# Patient Record
Sex: Female | Born: 2000
Health system: Southern US, Community
[De-identification: ages and names within clinical notes are randomized; demographics above are authoritative.]

## PROBLEM LIST (undated history)

## (undated) DIAGNOSIS — K219 Gastro-esophageal reflux disease without esophagitis: Secondary | ICD-10-CM

## (undated) DIAGNOSIS — E282 Polycystic ovarian syndrome: Secondary | ICD-10-CM

## (undated) DIAGNOSIS — G43909 Migraine, unspecified, not intractable, without status migrainosus: Secondary | ICD-10-CM

## (undated) DIAGNOSIS — Z9889 Other specified postprocedural states: Secondary | ICD-10-CM

## (undated) DIAGNOSIS — R112 Nausea with vomiting, unspecified: Secondary | ICD-10-CM

## (undated) HISTORY — PX: WISDOM TOOTH EXTRACTION: SHX21

## (undated) HISTORY — PX: LARYNGOSCOPY: SUR817

---
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
## 2021-08-03 ENCOUNTER — Other Ambulatory Visit: Payer: Self-pay | Admitting: Internal Medicine

## 2021-08-03 DIAGNOSIS — R1013 Epigastric pain: Secondary | ICD-10-CM

## 2021-08-16 ENCOUNTER — Ambulatory Visit
Admission: RE | Admit: 2021-08-16 | Discharge: 2021-08-16 | Disposition: A | Discharge status: Home / Self Care | Payer: 59 | Source: Ambulatory Visit | Attending: Internal Medicine | Admitting: Internal Medicine

## 2021-08-16 DIAGNOSIS — R1013 Epigastric pain: Secondary | ICD-10-CM

## 2021-09-13 ENCOUNTER — Encounter: Payer: Self-pay | Admitting: Surgery

## 2021-09-13 ENCOUNTER — Ambulatory Visit: Payer: Self-pay | Admitting: Surgery

## 2021-09-13 DIAGNOSIS — J019 Acute sinusitis, unspecified: Secondary | ICD-10-CM | POA: Insufficient documentation

## 2021-09-13 DIAGNOSIS — B079 Viral wart, unspecified: Secondary | ICD-10-CM | POA: Insufficient documentation

## 2021-09-13 DIAGNOSIS — Z6830 Body mass index (BMI) 30.0-30.9, adult: Secondary | ICD-10-CM | POA: Insufficient documentation

## 2021-09-13 DIAGNOSIS — R12 Heartburn: Secondary | ICD-10-CM | POA: Insufficient documentation

## 2021-09-13 DIAGNOSIS — J309 Allergic rhinitis, unspecified: Secondary | ICD-10-CM | POA: Insufficient documentation

## 2021-09-13 DIAGNOSIS — H6691 Otitis media, unspecified, right ear: Secondary | ICD-10-CM

## 2021-09-13 DIAGNOSIS — J020 Streptococcal pharyngitis: Secondary | ICD-10-CM

## 2021-09-13 DIAGNOSIS — S060X9A Concussion with loss of consciousness of unspecified duration, initial encounter: Secondary | ICD-10-CM

## 2021-09-13 DIAGNOSIS — K8010 Calculus of gallbladder with chronic cholecystitis without obstruction: Secondary | ICD-10-CM | POA: Insufficient documentation

## 2021-09-13 DIAGNOSIS — K760 Fatty (change of) liver, not elsewhere classified: Secondary | ICD-10-CM | POA: Insufficient documentation

## 2021-09-13 DIAGNOSIS — J029 Acute pharyngitis, unspecified: Secondary | ICD-10-CM

## 2021-09-13 DIAGNOSIS — R519 Headache, unspecified: Secondary | ICD-10-CM | POA: Insufficient documentation

## 2021-09-13 DIAGNOSIS — Z79899 Other long term (current) drug therapy: Secondary | ICD-10-CM | POA: Insufficient documentation

## 2021-09-13 DIAGNOSIS — F901 Attention-deficit hyperactivity disorder, predominantly hyperactive type: Secondary | ICD-10-CM | POA: Insufficient documentation

## 2021-09-13 DIAGNOSIS — J45990 Exercise induced bronchospasm: Secondary | ICD-10-CM | POA: Insufficient documentation

## 2021-09-13 HISTORY — DX: Streptococcal pharyngitis: J02.0

## 2021-09-13 HISTORY — DX: Concussion with loss of consciousness of unspecified duration, initial encounter: S06.0X9A

## 2021-09-13 HISTORY — DX: Acute pharyngitis, unspecified: J02.9

## 2021-09-13 HISTORY — DX: Otitis media, unspecified, right ear: H66.91

## 2021-10-06 NOTE — Patient Instructions (Addendum)
DUE TO COVID-19 ONLY TWO VISITORS  (aged 20 and older)  ARE ALLOWED TO COME WITH YOU AND STAY IN THE WAITING ROOM ONLY DURING PRE OP AND PROCEDURE.   ?**NO VISITORS ARE ALLOWED IN THE SHORT STAY AREA OR RECOVERY ROOM!!** ? ?IF YOU WILL BE ADMITTED INTO THE HOSPITAL YOU ARE ALLOWED ONLY FOUR SUPPORT PEOPLE DURING VISITATION HOURS ONLY (7 AM -8PM)   ?The support person(s) must pass our screening, gel in and out, and wear a mask at all times, including in the patient?s room. ?Patients must also wear a mask when staff or their support person are in the room. ?Visitors GUEST BADGE MUST BE WORN VISIBLY  ?One adult visitor may remain with you overnight and MUST be in the room by 8 P.M. ?  ? ? Your procedure is scheduled on: 10/20/21 ? ? Report to [facility] Main Entrance ? ?  Report to admitting at  1:15 PM ? ? Call this number if you have problems the morning of surgery [phone] ? ? Do not eat food :After Midnight. ? ? After Midnight you may have the following liquids until 12:30 PM  DAY OF SURGERY ? ?Water ?Black Coffee (sugar ok, NO MILK/CREAM OR CREAMERS)  ?Tea (sugar ok, NO MILK/CREAM OR CREAMERS) regular and decaf                             ?Plain Jell-O (NO RED)                                           ?Fruit ices (not with fruit pulp, NO RED)                                     ?Popsicles (NO RED)                                                                  ?Juice: apple, WHITE grape, WHITE cranberry ?Sports drinks like Gatorade (NO RED) ?Clear broth(vegetable,chicken,beef) ?  ?       If you have questions, please contact your surgeon?s office. ? ?Drink one Pre-Surgery Ensure the morning of surgery at 12:30 PM ?  ?Oral Hygiene is also important to reduce your risk of infection.                                    ?Remember - BRUSH YOUR TEETH THE MORNING OF SURGERY WITH YOUR REGULAR TOOTHPASTE ? ? Do NOT smoke after Midnight ? ? Take these medicines the morning of surgery with A SIP OF WATER:   Allegra, Pantoprazole..   Use your inhaler and bring it with you ?                  ?           You may not have any metal on your body including hair pins, jewelry, and body piercing ? ?  Do not wear make-up, lotions, powders, perfumes or deodorant ? ?Do not wear nail polish including gel and S&S, artificial/acrylic nails, or any other type of covering on natural nails including finger and toenails. If you have artificial nails, gel coating, etc. that needs to be removed by a nail salon please have this removed prior to surgery or surgery may need to be canceled/ delayed if the surgeon/ anesthesia feels like they are unable to be safely monitored.  ? ?Do not shave  48 hours prior to surgery.  ? ? Do not bring valuables to the hospital. [place]. ? ? Contacts, dentures or bridgework may not be worn into surgery. ?  ? Patients discharged on the day of surgery will not be allowed to drive home.  Someone NEEDS to stay with you for the first 24 hours after anesthesia. ? ? Please read over the following fact sheets you were given: IF [place]  ? ?   [facility] - Preparing for Surgery ?Before surgery, you can play an important role.  Because skin is not sterile, your skin needs to be as free of germs as possible.  You can reduce the number of germs on your skin by washing with CHG (chlorahexidine gluconate) soap before surgery.  CHG is an antiseptic cleaner which kills germs and bonds with the skin to continue killing germs even after washing. ?Please DO NOT use if you have an allergy to CHG or antibacterial soaps.  If your skin becomes reddened/irritated stop using the CHG and inform your nurse when you arrive at Short Stay. ?Do not shave (including legs and underarms) for at least 48 hours prior to the first CHG shower.   ?Please follow these instructions carefully: ? 1.  Shower with CHG Soap the night  before surgery and the  morning of Surgery. ? 2.  If you choose to wash your hair, wash your hair first as usual with your  normal  shampoo. ? 3.  After you shampoo, rinse your hair and body thoroughly to remove the  shampoo.                      ?      4.  Use CHG as you would any other liquid soap.  You can apply chg directly  to the skin and wash  ?                     Gently with a scrungie or clean washcloth. ? 5.  Apply the CHG Soap to your body ONLY FROM THE NECK DOWN.   Do not use on face/ open      ?                     Wound or open sores. Avoid contact with eyes, ears mouth and genitals (private parts).  ?                     Production manager,  Genitals (private parts) with your normal soap. ?            6.  Wash thoroughly, paying special attention to the area where your surgery  will be performed. ? 7.  Thoroughly rinse your body with warm water from the neck down. ? 8.  DO NOT shower/wash with your normal soap after using and  rinsing off  the CHG Soap. ?               9.  Pat yourself dry with a clean towel. ?           10.  Wear clean pajamas. ?           11.  Place clean sheets on your bed the night of your first shower and do not  sleep with pets. ?Day of Surgery : ?Do not apply any lotions/deodorants the morning of surgery.  Please wear clean clothes to the hospital/surgery center. ? ?FAILURE TO FOLLOW THESE INSTRUCTIONS MAY RESULT IN THE CANCELLATION OF YOUR SURGERY ? ? ?________________________________________________________________________  ?

## 2021-10-08 ENCOUNTER — Encounter (HOSPITAL_COMMUNITY): Payer: Self-pay

## 2021-10-08 ENCOUNTER — Encounter (HOSPITAL_COMMUNITY)
Admission: RE | Admit: 2021-10-08 | Discharge: 2021-10-08 | Disposition: A | Discharge status: Home / Self Care | Payer: 59 | Source: Ambulatory Visit | Attending: Surgery | Admitting: Surgery

## 2021-10-08 ENCOUNTER — Other Ambulatory Visit: Payer: Self-pay

## 2021-10-08 DIAGNOSIS — Z01818 Encounter for other preprocedural examination: Secondary | ICD-10-CM

## 2021-10-08 DIAGNOSIS — Z01812 Encounter for preprocedural laboratory examination: Secondary | ICD-10-CM | POA: Insufficient documentation

## 2021-10-08 HISTORY — DX: Migraine, unspecified, not intractable, without status migrainosus: G43.909

## 2021-10-08 HISTORY — DX: Gastro-esophageal reflux disease without esophagitis: K21.9

## 2021-10-08 HISTORY — DX: Other specified postprocedural states: R11.2

## 2021-10-08 HISTORY — DX: Polycystic ovarian syndrome: E28.2

## 2021-10-08 HISTORY — DX: Other specified postprocedural states: Z98.890

## 2021-10-08 LAB — COMPREHENSIVE METABOLIC PANEL
ALT: 29 U/L (ref 0–44)
AST: 35 U/L (ref 15–41)
Albumin: 4.7 g/dL (ref 3.5–5.0)
Alkaline Phosphatase: 49 U/L (ref 38–126)
Anion gap: 5 (ref 5–15)
BUN: 12 mg/dL (ref 6–20)
CO2: 24 mmol/L (ref 22–32)
Calcium: 9.6 mg/dL (ref 8.9–10.3)
Chloride: 107 mmol/L (ref 98–111)
Creatinine, Ser: 0.71 mg/dL (ref 0.44–1.00)
GFR, Estimated: >60 mL/min (ref >60)
Glucose, Bld: 82 mg/dL (ref 70–99)
Potassium: 4.3 mmol/L (ref 3.5–5.1)
Sodium: 136 mmol/L (ref 135–145)
Total Bilirubin: 0.8 mg/dL (ref 0.3–1.2)
Total Protein: 8.2 g/dL — ABNORMAL HIGH (ref 6.5–8.1)

## 2021-10-08 LAB — CBC
HCT: 42 % (ref 36.0–46.0)
Hemoglobin: 14.9 g/dL (ref 12.0–15.0)
MCH: 30.2 pg (ref 26.0–34.0)
MCHC: 35.5 g/dL (ref 30.0–36.0)
MCV: 85 fL (ref 80.0–100.0)
Platelets: 226 10*3/uL (ref 150–400)
RBC: 4.94 MIL/uL (ref 3.87–5.11)
RDW: 12.1 % (ref 11.5–15.5)
WBC: 5.6 10*3/uL (ref 4.0–10.5)
nRBC: 0 % (ref 0.0–0.2)

## 2021-10-08 NOTE — Progress Notes (Addendum)
COVID Vaccine Completed:    No ?Date COVID Vaccine completed: ?Has received booster: ?COVID vaccine manufacturer: [facility]  ? ?Date of COVID positive in last 90 days:  No ? ?PCP - Marton Redwood, MD ?Cardiologist - N/A ? ?Chest x-ray - N/A ?EKG - N/A ?Stress Test - N/A ?ECHO - N/A ?Cardiac Cath -  ?Pacemaker/ICD device last checked: ?Spinal Cord Stimulator: ? ?Bowel Prep - N/A ? ?Sleep Study - N/A ?CPAP -  ? ?Fasting Blood Sugar -  ?Checks Blood Sugar _____ times a day ? ?Blood Thinner Instructions:  N/A ?Aspirin Instructions: ?Last Dose: ? ?Activity level:   Can go up a flight of stairs and perform activities of daily living without stopping and without symptoms of chest pain or shortness of breath.  Able to exercise without symptoms ? ?Anesthesia review: N/A ? ?Patient denies shortness of breath, fever, cough and chest pain at PAT appointment ? ? ?Patient verbalized understanding of instructions that were given to them at the PAT appointment. Patient was also instructed that they will need to review over the PAT instructions again at home before surgery.  ?

## 2021-10-19 NOTE — Progress Notes (Signed)
Pt aware to arrive [facility] admitting at 0900 on 10/20/2021 for scheduled surgical procedure. Pt aware no food after midnight; clear liquids from midnight till 0830 consuming entire pre surgery drink by 0830 then nothing by mouth.  ?

## 2021-10-20 ENCOUNTER — Ambulatory Visit (HOSPITAL_COMMUNITY): Payer: 59 | Admitting: Certified Registered Nurse Anesthetist

## 2021-10-20 ENCOUNTER — Encounter (HOSPITAL_COMMUNITY)
Admission: RE | Disposition: A | Discharge status: Home / Self Care | Payer: Self-pay | Source: Ambulatory Visit | Attending: Surgery

## 2021-10-20 ENCOUNTER — Encounter (HOSPITAL_COMMUNITY): Payer: Self-pay | Admitting: Surgery

## 2021-10-20 ENCOUNTER — Ambulatory Visit (HOSPITAL_COMMUNITY): Payer: 59

## 2021-10-20 ENCOUNTER — Ambulatory Visit (HOSPITAL_COMMUNITY)
Admission: RE | Admit: 2021-10-20 | Discharge: 2021-10-20 | Disposition: A | Discharge status: Home / Self Care | Payer: 59 | Source: Ambulatory Visit | Attending: Surgery | Admitting: Surgery

## 2021-10-20 ENCOUNTER — Other Ambulatory Visit: Payer: Self-pay

## 2021-10-20 ENCOUNTER — Ambulatory Visit (HOSPITAL_BASED_OUTPATIENT_CLINIC_OR_DEPARTMENT_OTHER): Payer: 59 | Admitting: Certified Registered Nurse Anesthetist

## 2021-10-20 DIAGNOSIS — R519 Headache, unspecified: Secondary | ICD-10-CM | POA: Diagnosis not present

## 2021-10-20 DIAGNOSIS — F909 Attention-deficit hyperactivity disorder, unspecified type: Secondary | ICD-10-CM | POA: Diagnosis not present

## 2021-10-20 DIAGNOSIS — K760 Fatty (change of) liver, not elsewhere classified: Secondary | ICD-10-CM | POA: Diagnosis present

## 2021-10-20 DIAGNOSIS — F901 Attention-deficit hyperactivity disorder, predominantly hyperactive type: Secondary | ICD-10-CM | POA: Diagnosis present

## 2021-10-20 DIAGNOSIS — K811 Chronic cholecystitis: Secondary | ICD-10-CM | POA: Diagnosis not present

## 2021-10-20 DIAGNOSIS — J45990 Exercise induced bronchospasm: Secondary | ICD-10-CM | POA: Diagnosis present

## 2021-10-20 DIAGNOSIS — K8010 Calculus of gallbladder with chronic cholecystitis without obstruction: Secondary | ICD-10-CM | POA: Diagnosis present

## 2021-10-20 DIAGNOSIS — Z01818 Encounter for other preprocedural examination: Secondary | ICD-10-CM

## 2021-10-20 HISTORY — PX: LAPAROSCOPIC CHOLECYSTECTOMY SINGLE SITE WITH INTRAOPERATIVE CHOLANGIOGRAM: SHX6538

## 2021-10-20 LAB — PREGNANCY, URINE: Preg Test, Ur: NEGATIVE

## 2021-10-20 SURGERY — LAPAROSCOPIC CHOLECYSTECTOMY SINGLE SITE WITH INTRAOPERATIVE CHOLANGIOGRAM
Anesthesia: General | Site: Abdomen

## 2021-10-20 MED ORDER — FENTANYL CITRATE (PF) 250 MCG/5ML IJ SOLN
INTRAMUSCULAR | Status: AC
  Filled 2021-10-20: qty 5

## 2021-10-20 MED ORDER — MIDAZOLAM HCL 2 MG/2ML IJ SOLN
INTRAMUSCULAR | Status: AC
  Filled 2021-10-20: qty 2

## 2021-10-20 MED ORDER — CHLORHEXIDINE GLUCONATE CLOTH 2 % EX PADS: 6.0000 | MEDICATED_PAD | Freq: Once | CUTANEOUS | Status: DC

## 2021-10-20 MED ORDER — LACTATED RINGERS IV SOLN: INTRAVENOUS | Status: DC

## 2021-10-20 MED ORDER — DEXAMETHASONE SODIUM PHOSPHATE 4 MG/ML IJ SOLN
INTRAMUSCULAR | Status: DC | PRN
Start: 2021-10-20 — End: 2021-10-20
  Administered 2021-10-20: 10 mg via INTRAVENOUS

## 2021-10-20 MED ORDER — SCOPOLAMINE 1 MG/3DAYS TD PT72
1.0000 | MEDICATED_PATCH | TRANSDERMAL | Status: DC
  Administered 2021-10-20: 1.5 mg via TRANSDERMAL

## 2021-10-20 MED ORDER — LIDOCAINE 2% (20 MG/ML) 5 ML SYRINGE
INTRAMUSCULAR | Status: DC | PRN
  Administered 2021-10-20: 40 mg via INTRAVENOUS

## 2021-10-20 MED ORDER — ORAL CARE MOUTH RINSE: 15.0000 mL | Freq: Once | OROMUCOSAL | Status: AC

## 2021-10-20 MED ORDER — BUPIVACAINE LIPOSOME 1.3 % IJ SUSP
INTRAMUSCULAR | Status: AC
  Filled 2021-10-20: qty 20

## 2021-10-20 MED ORDER — BUPIVACAINE LIPOSOME 1.3 % IJ SUSP
INTRAMUSCULAR | Status: DC | PRN
  Administered 2021-10-20: 20 mL

## 2021-10-20 MED ORDER — FENTANYL CITRATE (PF) 100 MCG/2ML IJ SOLN
INTRAMUSCULAR | Status: DC | PRN
  Administered 2021-10-20: 50 ug via INTRAVENOUS
  Administered 2021-10-20: 100 ug via INTRAVENOUS
  Administered 2021-10-20 (×2): 50 ug via INTRAVENOUS

## 2021-10-20 MED ORDER — SODIUM CHLORIDE 0.9 % IV SOLN
2.0000 g | INTRAVENOUS | Status: AC
  Administered 2021-10-20: 2 g via INTRAVENOUS
  Filled 2021-10-20: qty 20

## 2021-10-20 MED ORDER — PROPOFOL 10 MG/ML IV BOLUS
INTRAVENOUS | Status: DC | PRN
Start: 2021-10-20 — End: 2021-10-20
  Administered 2021-10-20: 140 mg via INTRAVENOUS

## 2021-10-20 MED ORDER — ENSURE PRE-SURGERY PO LIQD
296.0000 mL | Freq: Once | ORAL | Status: DC
Start: 2021-10-21 — End: 2021-10-20

## 2021-10-20 MED ORDER — LIDOCAINE HCL (PF) 2 % IJ SOLN
INTRAMUSCULAR | Status: AC
  Filled 2021-10-20: qty 5

## 2021-10-20 MED ORDER — TRAMADOL HCL 50 MG PO TABS: 50.0000 mg | ORAL_TABLET | Freq: Four times a day (QID) | ORAL | 0 refills | Status: AC | PRN

## 2021-10-20 MED ORDER — CELECOXIB 200 MG PO CAPS
200.0000 mg | ORAL_CAPSULE | ORAL | Status: AC
  Administered 2021-10-20: 200 mg via ORAL
  Filled 2021-10-20: qty 1

## 2021-10-20 MED ORDER — HYDROMORPHONE HCL 1 MG/ML IJ SOLN
0.2500 mg | INTRAMUSCULAR | Status: DC | PRN
  Administered 2021-10-20 (×2): 0.5 mg via INTRAVENOUS

## 2021-10-20 MED ORDER — ONDANSETRON HCL 4 MG PO TABS
4.0000 mg | ORAL_TABLET | Freq: Three times a day (TID) | ORAL | 5 refills | Status: AC | PRN
Start: 2021-10-20 — End: ?

## 2021-10-20 MED ORDER — ROCURONIUM BROMIDE 10 MG/ML (PF) SYRINGE
PREFILLED_SYRINGE | INTRAVENOUS | Status: DC | PRN
  Administered 2021-10-20: 10 mg via INTRAVENOUS
  Administered 2021-10-20: 60 mg via INTRAVENOUS

## 2021-10-20 MED ORDER — BUPIVACAINE-EPINEPHRINE 0.25% -1:200000 IJ SOLN
INTRAMUSCULAR | Status: DC | PRN
  Administered 2021-10-20: 30 mL

## 2021-10-20 MED ORDER — GABAPENTIN 300 MG PO CAPS
300.0000 mg | ORAL_CAPSULE | ORAL | Status: AC
  Administered 2021-10-20: 300 mg via ORAL
  Filled 2021-10-20: qty 1

## 2021-10-20 MED ORDER — ROCURONIUM BROMIDE 10 MG/ML (PF) SYRINGE
PREFILLED_SYRINGE | INTRAVENOUS | Status: AC
  Filled 2021-10-20: qty 10

## 2021-10-20 MED ORDER — SUGAMMADEX SODIUM 200 MG/2ML IV SOLN
INTRAVENOUS | Status: DC | PRN
Start: 2021-10-20 — End: 2021-10-20
  Administered 2021-10-20: 200 mg via INTRAVENOUS

## 2021-10-20 MED ORDER — MIDAZOLAM HCL 5 MG/5ML IJ SOLN
INTRAMUSCULAR | Status: DC | PRN
  Administered 2021-10-20: 2 mg via INTRAVENOUS

## 2021-10-20 MED ORDER — CHLORHEXIDINE GLUCONATE 0.12 % MT SOLN
15.0000 mL | Freq: Once | OROMUCOSAL | Status: AC
  Administered 2021-10-20: 15 mL via OROMUCOSAL

## 2021-10-20 MED ORDER — LIDOCAINE HCL (PF) 2 % IJ SOLN
INTRAMUSCULAR | Status: DC | PRN
  Administered 2021-10-20: 1.5 mg/kg/h via INTRADERMAL

## 2021-10-20 MED ORDER — ONDANSETRON HCL 4 MG/2ML IJ SOLN
INTRAMUSCULAR | Status: AC
  Filled 2021-10-20: qty 2

## 2021-10-20 MED ORDER — ACETAMINOPHEN 500 MG PO TABS
1000.0000 mg | ORAL_TABLET | ORAL | Status: AC
  Administered 2021-10-20: 1000 mg via ORAL
  Filled 2021-10-20: qty 2

## 2021-10-20 MED ORDER — SCOPOLAMINE 1 MG/3DAYS TD PT72
MEDICATED_PATCH | TRANSDERMAL | Status: AC
  Filled 2021-10-20: qty 1

## 2021-10-20 MED ORDER — 0.9 % SODIUM CHLORIDE (POUR BTL) OPTIME
TOPICAL | Status: DC | PRN
  Administered 2021-10-20: 1000 mL

## 2021-10-20 MED ORDER — DEXAMETHASONE SODIUM PHOSPHATE 10 MG/ML IJ SOLN
INTRAMUSCULAR | Status: AC
  Filled 2021-10-20: qty 1

## 2021-10-20 MED ORDER — PHENYLEPHRINE 80 MCG/ML (10ML) SYRINGE FOR IV PUSH (FOR BLOOD PRESSURE SUPPORT)
PREFILLED_SYRINGE | INTRAVENOUS | Status: DC | PRN
  Administered 2021-10-20 (×2): 80 ug via INTRAVENOUS

## 2021-10-20 MED ORDER — BUPIVACAINE LIPOSOME 1.3 % IJ SUSP: 20.0000 mL | Freq: Once | INTRAMUSCULAR | Status: DC

## 2021-10-20 MED ORDER — BUPIVACAINE-EPINEPHRINE (PF) 0.25% -1:200000 IJ SOLN
INTRAMUSCULAR | Status: AC
  Filled 2021-10-20: qty 30

## 2021-10-20 MED ORDER — LACTATED RINGERS IR SOLN
Status: DC | PRN
  Administered 2021-10-20: 1000 mL

## 2021-10-20 MED ORDER — HYDROMORPHONE HCL 1 MG/ML IJ SOLN
INTRAMUSCULAR | Status: AC
  Administered 2021-10-20: 0.5 mg via INTRAVENOUS
  Filled 2021-10-20: qty 2

## 2021-10-20 MED ORDER — ONDANSETRON HCL 4 MG/2ML IJ SOLN
INTRAMUSCULAR | Status: DC | PRN
  Administered 2021-10-20: 4 mg via INTRAVENOUS

## 2021-10-20 MED ORDER — PROPOFOL 10 MG/ML IV BOLUS
INTRAVENOUS | Status: AC
  Filled 2021-10-20: qty 20

## 2021-10-20 SURGICAL SUPPLY — 43 items
APPLIER CLIP 5 13 M/L LIGAMAX5 (MISCELLANEOUS) ×3
BAG COUNTER SPONGE SURGICOUNT (BAG) IMPLANT
CABLE HIGH FREQUENCY MONO STRZ (ELECTRODE) ×3 IMPLANT
CHLORAPREP W/TINT 26 (MISCELLANEOUS) ×3 IMPLANT
CLIP APPLIE 5 13 M/L LIGAMAX5 (MISCELLANEOUS) ×2 IMPLANT
COVER MAYO STAND STRL (DRAPES) ×3 IMPLANT
COVER SURGICAL LIGHT HANDLE (MISCELLANEOUS) ×3 IMPLANT
DRAIN CHANNEL 19F RND (DRAIN) IMPLANT
DRAPE C-ARM 42X120 X-RAY (DRAPES) ×3 IMPLANT
DRAPE WARM FLUID 44X44 (DRAPES) ×3 IMPLANT
DRSG TEGADERM 4X4.75 (GAUZE/BANDAGES/DRESSINGS) ×3 IMPLANT
ELECT REM PT RETURN 15FT ADLT (MISCELLANEOUS) ×3 IMPLANT
ENDOLOOP SUT PDS II  0 18 (SUTURE)
ENDOLOOP SUT PDS II 0 18 (SUTURE) IMPLANT
EVACUATOR SILICONE 100CC (DRAIN) IMPLANT
GAUZE SPONGE 2X2 8PLY STRL LF (GAUZE/BANDAGES/DRESSINGS) ×2 IMPLANT
GLOVE ECLIPSE 8.0 STRL XLNG CF (GLOVE) ×3 IMPLANT
GLOVE INDICATOR 8.0 STRL GRN (GLOVE) ×3 IMPLANT
GOWN STRL REUS W/ TWL XL LVL3 (GOWN DISPOSABLE) ×6 IMPLANT
GOWN STRL REUS W/TWL XL LVL3 (GOWN DISPOSABLE) ×9
IRRIG SUCT STRYKERFLOW 2 WTIP (MISCELLANEOUS) ×3
IRRIGATION SUCT STRKRFLW 2 WTP (MISCELLANEOUS) ×2 IMPLANT
KIT BASIN OR (CUSTOM PROCEDURE TRAY) ×3 IMPLANT
KIT TURNOVER KIT A (KITS) IMPLANT
PAD POSITIONING PINK XL (MISCELLANEOUS) ×3 IMPLANT
PENCIL SMOKE EVACUATOR (MISCELLANEOUS) IMPLANT
POUCH RETRIEVAL ECOSAC 10 (ENDOMECHANICALS) ×1 IMPLANT
POUCH RETRIEVAL ECOSAC 10MM (ENDOMECHANICALS) ×3
PROTECTOR NERVE ULNAR (MISCELLANEOUS) IMPLANT
SCISSORS LAP 5X35 DISP (ENDOMECHANICALS) ×3 IMPLANT
SET CHOLANGIOGRAPH MIX (MISCELLANEOUS) ×3 IMPLANT
SET TUBE SMOKE EVAC HIGH FLOW (TUBING) ×3 IMPLANT
SHEARS HARMONIC ACE PLUS 36CM (ENDOMECHANICALS) ×3 IMPLANT
SPIKE FLUID TRANSFER (MISCELLANEOUS) ×3 IMPLANT
SPONGE GAUZE 2X2 STER 10/PKG (GAUZE/BANDAGES/DRESSINGS) ×1
SUT MNCRL AB 4-0 PS2 18 (SUTURE) ×3 IMPLANT
SUT PDS AB 1 CT1 27 (SUTURE) ×6 IMPLANT
SYR 20ML LL LF (SYRINGE) ×3 IMPLANT
TOWEL OR 17X26 10 PK STRL BLUE (TOWEL DISPOSABLE) ×3 IMPLANT
TOWEL OR NON WOVEN STRL DISP B (DISPOSABLE) ×3 IMPLANT
TRAY LAPAROSCOPIC (CUSTOM PROCEDURE TRAY) ×3 IMPLANT
TROCAR BLADELESS OPT 5 100 (ENDOMECHANICALS) ×3 IMPLANT
TROCAR BLADELESS OPT 5 150 (ENDOMECHANICALS) ×3 IMPLANT

## 2021-10-20 NOTE — Transfer of Care (Signed)
Immediate Anesthesia Transfer of Care Note ? ?Patient: Victoria Duran ? ?Procedure(s) Performed: Procedure(s): ?LAPAROSCOPIC CHOLECYSTECTOMY SINGLE SITE WITH INTRAOPERATIVE CHOLANGIOGRAM, BILATERAL TAP BLOCK (N/A) ? ?Patient Location: PACU ? ?Anesthesia Type:General ? ?Level of Consciousness: Patient easily awoken, sedated, comfortable, cooperative, following commands, responds to stimulation.  ? ?Airway & Oxygen Therapy: Patient spontaneously breathing, ventilating well, oxygen via simple oxygen mask. ? ?Post-op Assessment: Report given to PACU RN, vital signs reviewed and stable, moving all extremities.  ? ?Post vital signs: Reviewed and stable. ? ?Complications: No apparent anesthesia complications ?Last Vitals:  ?Vitals Value Taken Time  ?BP 143/71 10/20/21 1234  ?Temp 36.6 ?C 10/20/21 1234  ?Pulse 78 10/20/21 1238  ?Resp 11 10/20/21 1238  ?SpO2 100 % 10/20/21 1238  ?Vitals shown include unvalidated device data. ? ?Last Pain:  ?Vitals:  ? 10/20/21 1234  ?TempSrc:   ?PainSc: Asleep  ?   ? ?  ? ?Complications: No notable events documented. ?

## 2021-10-20 NOTE — Op Note (Signed)
10/20/2021 ? ?PATIENT:  Victoria Duran  21 y.o. female ? ?Patient Care Team: ?Ginger Organ., MD as PCP - General (Internal Medicine) ?Michael Boston, MD as Consulting Physician (General Surgery) ?Melida Quitter, MD as Consulting Physician (Otolaryngology) ? ?PRE-OPERATIVE DIAGNOSIS:   ? ?Chronic Calculus cholecystitis ? ?POST-OPERATIVE DIAGNOSIS:  ? ?Chronic Calculus cholecystitis ? ?PROCEDURE:  SINGLE SITE Laparoscopic cholecystectomy with intraoperative cholangiogram (CPT code [id number]) ? ?SURGEON:  Adin Hector, MD, FACS. ? ?ASSISTANT: OR Staff  ? ?ANESTHESIA:    ?General with endotracheal intubation ?Local anesthetic as a field block ? ?EBL:  (See Anesthesia Intraoperative Record) ?Total I/O ?In: 200 [I.V.:100; IV Piggyback:100] ?Out: -  ? ?Delay start of Pharmacological VTE agent (>24hrs) due to surgical blood loss or risk of bleeding:  no ? ?DRAINS: None  ? ?SPECIMEN: Gallbladder   ? ?DISPOSITION OF SPECIMEN:  PATHOLOGY ? ?COUNTS:  YES ? ?PLAN OF CARE: Discharge to home after PACU ? ?PATIENT DISPOSITION:  PACU - hemodynamically stable. ? ?INDICATION: Young woman with classic story biliary colic.  Rest of differential diagnosis underwhelming.  I offered cholecystectomy ? ?The anatomy & physiology of hepatobiliary & pancreatic function was discussed.  The pathophysiology of gallbladder dysfunction was discussed.  Natural history risks without surgery was discussed.   I feel the risks of no intervention will lead to serious problems that outweigh the operative risks; therefore, I recommended cholecystectomy to remove the pathology.  I explained laparoscopic techniques with possible need for an open approach.  Probable cholangiogram to evaluate the bilary tract was explained as well.   ? ?Risks such as bleeding, infection, abscess, leak, injury to other organs, need for further treatment, heart attack, death, and other risks were discussed.  I noted a good likelihood this will help address the problem.   Possibility that this will not correct all abdominal symptoms was explained.  Goals of post-operative recovery were discussed as well.  We will work to minimize complications.  An educational handout further explaining the pathology and treatment options was given as well.  Questions were answered.  The patient expresses understanding & wishes to proceed with surgery. ? ?OR FINDINGS: Distended turgid gallbladder with omental adhesions suspicious for chronic cholecystitis ?Rather narrowed cystic duct suspicious for partial cystic duct obstruction but eventually able to pass cholangiogram through.  Cholangiogram with classic biliary anatomy.  No choledocholithiasis. ? ?Liver: normal ? ?DESCRIPTION:  ? ?The patient was identified & brought in the operating room. The patient was positioned supine with arms tucked. SCDs were active during the entire case. The patient underwent general anesthesia without any difficulty.  The abdomen was prepped and draped in a sterile fashion. A Surgical Timeout confirmed our plan. ? ?I made a transverse curvilinear incision through the superior umbilical fold.  I placed a 33m long port through the supraumbilical fascia using a modified Hassan cutdown technique with umbilical stalk fascial countertraction. I began carbon dioxide insufflation.  No change in end tidal CO2 measurement.   Camera inspection revealed no injury. There were no adhesions to the anterior abdominal wall supraumbilically.  I proceeded to continue with single site technique. I placed a #5 port in left upper aspect of the wound. I placed a 5 mm atraumatic grasper in the right inferior aspect of the wound. ? ?I turned attention to the right upper quadrant.  Gallbladder was distended and turgid with some adhesions but I was able to grasp it.  The gallbladder fundus was elevated cephalad. I freed adhesions to the ventral surface  of the gallbladder off carefully.  I freed the peritoneal coverings between the gallbladder  and the liver on the posteriolateral and anteriomedial walls. I alternated between Harmonic & blunt Maryland dissection to help get a good critical view of the cystic artery and cystic duct.  did further dissection to free 50%of the gallbladder off the liver bed to get a good critical view of the infundibulum and cystic duct. I dissected out the cystic artery; and, after getting a good 360? view, ligated the anterior & posterior branches of the cystic artery close on the infundibulum using the Harmonic ultrasonic dissection. ? ?I skeletonized the cystic duct.  I placed a clip on the infundibulum. I did a partial cystic duct-otomy and ensured patency. I placed a 5 [place] cholangiocatheter through a puncture site at the right subcostal ridge of the abdominal wall and directed it into the cystic duct.  Cystic duct was rather narrowed and fibrotic but eventually able to safely pass the catheter.  We ran a cholangiogram with dilute radio-opaque contrast and continuous fluoroscopy. Contrast flowed from a side branch consistent with cystic duct cannulization. Contrast flowed up the common hepatic duct into the right and left intrahepatic chains out to secondary radicals. Contrast flowed down the common bile duct easily across the normal ampulla into the duodenum.  This was consistent with a normal cholangiogram. ? ?I removed the cholangiocatheter. I placed clips on the cystic duct x4.  I completed cystic duct transection. I freed the gallbladder from its remaining attachments to the liver.  The liver did not have any significant fatty change and certainly no cirrhosis, so I did not feel a liver biopsy was warranted.  I ensured hemostasis on the gallbladder fossa of the liver and elsewhere. I inspected the rest of the abdomen & detected no injury nor bleeding elsewhere. ? ?I removed the gallbladder out the supraumbilical fascia. I closed the fascia transversely using  #1 PDS interrupted stitches. I closed the skin using 4-0  monocryl stitch.  Sterile dressing was applied. The patient was extubated & arrived in the PACU in stable condition.. ? ?I had discussed postoperative care with the patient in the holding area. I discussed operative findings, updated the patient's status, discussed probable steps to recovery, and gave postoperative recommendations to the  patient's mother. .  Recommendations were made.  Questions were answered.  She expressed understanding & appreciation. ? ?Adin Hector, M.D., F.A.C.S. ?Gastrointestinal and Minimally Invasive Surgery ?[facility] ?[address] ?[place], [place] [address] ?[phone] Main / Paging ? ?10/20/2021 ?12:31 PM  ?

## 2021-10-20 NOTE — H&P (Signed)
?10/20/2021 ? ? ?REFERRING PHYSICIAN: [facility] ? ?Patient Care Team: ?Rebeca Allegra., MD as PCP - General (Internal Medicine) ? ?PROVIDER: Hollace Kinnier, MD ? ?[facility] MRN: [id number] ?DOB: 08/19/2000 ?DATE OF ENCOUNTER: 10/20/2021 ? ? ?SUBJECTIVE  ? ?Chief Complaint: Abdominal Pain ? ? ?History of Present Illness: ?Victoria Duran is a 21 y.o. female who is seen today  ?as an office consultation at the request of Dr. Brigitte Pulse ?for evaluation of Abdominal Pain ?.  ? ?Pleasant healthy young woman. Electronics engineer at [employer]. Has had intermittent episodes of epigastric and somewhat right-sided abdominal pain. Usually triggered by eating. Worse attack when she had some pizza with some friends. No one else got sick. Felt a lot of pressure stabbing. And nauseated. Not quite throwing up. Recurrent attacks. Tried some ibuprofen without much help. Can she can walk half hour without difficulty. She used to play soccer but had some bad concussions. Occasional migraines. That is relatively stable. ADHD. Today with her parents. Mother concerned about eating & attacks. ?Discussed with primary care physician. Placed on pantoprazole. She has had some improvements in her heartburn but she is still getting attacks. She notes her bowels are much more loose now. Used to be a few times a day. Now over 5 times a day. She has had some hesitancy eat certain foods or diets been somewhat erratic. She had some stress in college but that has not changed markedly. Otherwise rather active. No diabetes. Does not smoke. Some question of polycystic ovary disease on metformin.  ? ?Ready for surgery.   ? ? ? ?Medical History: ? ?Past Medical History:  ?Diagnosis Date  ? Asthma, unspecified asthma severity, unspecified whether complicated, unspecified whether persistent  ? GERD (gastroesophageal reflux disease)  ? ?Patient Active Problem List  ?Diagnosis  ? Chronic cholecystitis with calculus  ? Heartburn  ? BMI  30.0-30.9,adult  ? ?Past Surgical History:  ?Procedure Laterality Date  ? larynagoscopy  ? ? ?Allergies  ?Allergen Reactions  ? Neomycin Rash  ? ?Current Outpatient Medications on File Prior to Visit  ?Medication Sig Dispense Refill  ? fexofenadine (ALLEGRA ALLERGY) 180 MG tablet Take 1 tablet by mouth once daily  ? lisdexamfetamine (VYVANSE) 30 MG capsule 1 capsule in the morning  ? topiramate (TOPAMAX) 25 MG tablet 25 tablets  ? albuterol 90 mcg/actuation inhaler 15 inhalations  ? ubrogepant (UBRELVY) 100 mg Tab 1 tablet may take second dose at least 2 hours after first dose as needed  ? ubrogepant (UBRELVY) 100 mg Tab 1 (one) time each day at the same time.  ? ?No current facility-administered medications on file prior to visit.  ? ?Family History  ?Problem Relation Age of Onset  ? Obesity Mother  ? Hyperlipidemia (Elevated cholesterol) Mother  ? Heart valve disease Mother  ? Diabetes Mother  ? Skin cancer Father  ? Obesity Father  ? High blood pressure (Hypertension) Father  ? Obesity Sister  ? Diabetes Sister  ? Stroke Maternal Grandmother  ? Stroke Paternal Grandmother  ? Heart valve disease Paternal Grandmother  ? Colon cancer Paternal Grandmother  ? ? ?Social History  ? ?Tobacco Use  ?Smoking Status Never  ? Passive exposure: Never  ?Smokeless Tobacco Never  ? ? ?Social History  ? ?Socioeconomic History  ? Marital status: Single  ?Tobacco Use  ? Smoking status: Never  ?Passive exposure: Never  ? Smokeless tobacco: Never  ?Substance and Sexual Activity  ? Alcohol use: Never  ? Drug use: Never  ? ?############################################################ ? ?  Review of Systems: ?A complete review of systems (ROS) was obtained from the patient. I have reviewed this information and discussed as appropriate with the patient. See HPI as well for other pertinent ROS. ? ?Constitutional: No fevers, chills, sweats. Weight stable ?Eyes: No vision changes, No discharge ?HENT: No sore throats, nasal drainage ?Lymph: No  neck swelling, No bruising easily ?Pulmonary: No cough, productive sputum ?CV: No orthopnea, PND . No exertional chest/neck/shoulder/arm pain. Patient can walk 2-3 miles without difficulty.  ? ?GI: No personal nor family history of GI/colon cancer, inflammatory bowel disease, irritable bowel syndrome, allergy such as Celiac Sprue, dietary/dairy problems, colitis, ulcers nor gastritis. No recent sick contacts/gastroenteritis. No travel outside the country. No changes in diet. ? ?Renal: No UTIs, No hematuria ?Genital: No drainage, bleeding, masses ?Musculoskeletal: No severe joint pain. Good ROM major joints ?Skin: No sores or lesions ?Heme/Lymph: No easy bleeding. No swollen lymph nodes ?Neuro: No active seizures. No facial droop ?Psych: No hallucinations. No agitation ? ?OBJECTIVE  ? ?Vitals:  ?09/13/21 1607  ?BP: 126/80  ?Pulse: 97  ?Temp: 36.9 ?C (98.4 ?F)  ?SpO2: 98%  ?Weight: 96.1 kg (211 lb 12.8 oz)  ?Height: 170.2 cm ('5\' 7"'$ )  ?Body mass index is 33.17 kg/m?. ? ? ? ?PHYSICAL EXAM: ? ?Constitutional: Not cachectic. Hygeine adequate. Vitals signs as above. Sitting normally. Not toxic nor sickly. ?Eyes: Pupils reactive, normal extraocular movements. Sclera nonicteric ?Neuro: CN II-XII intact. No major focal sensory defects. No major motor deficits. ?Lymph: No head/neck/groin lymphadenopathy ?Psych: A little nervous but reassured. Pleasant. No severe agitation. No severe anxiety. Judgment & insight Adequate, Oriented x4, ?HENT: Normocephalic, Mucus membranes moist. No thrush. Hearing: adequate ?Neck: Supple, No tracheal deviation. No obvious thyromegaly ?Chest: No pain to chest wall compression. Good respiratory excursion. No audible wheezing ?CV: Pulses intact. regular. No major extremity edema ?Ext: No obvious deformity or contracture. Edema: Not present. No cyanosis ?Skin: No major subcutaneous nodules. Warm and dry ?Musculoskeletal: Severe joint rigidity not present. No obvious clubbing. No digital petechiae.  Mobility: no assist device moving easily without restrictions ? ?Abdomen: Obese Soft. Nondistended. Mild epigastric and right upper quadrant discomfort. No pain in left upper quadrant, bilateral flanks, lower quadrants. ?. Hernia: Not present. Diastasis recti: Not present. No hepatomegaly. No splenomegaly. ? ?Genital/Pelvic: Inguinal hernia: Not present. Inguinal lymph nodes: without lymphadenopathy nor hidradenitis.  ? ?Rectal: (Deferred) ? ? ? ?################################################################### ? ?Labs, Imaging and Diagnostic Testing: ? ?Located in [facility] section of Epic EMR chart ? ?Complete metabolic panel underwhelming. ? ?Ultrasound shows gallstones. No cholecystitis. Perhaps mild fatty change in the liver. ? ?PRIOR [facility] CLINIC NOTES: ? ?Not applicable ? ?SURGERY NOTES: ? ?Not applicable ? ?PATHOLOGY: ? ?Not applicable ? ?Assessment and Plan:  ?DIAGNOSES: ? ?Diagnoses and all orders for this visit: ? ?Chronic cholecystitis with calculus ? ?Heartburn ? ?BMI 30.0-30.9,adult ? ? ? ?ASSESSMENT/PLAN ? ?Pretty classic story of intermittent biliary colic with food triggers and known gallstones. Question of mild heartburn and reflux. However persistent attacks despite being on proton pump inhibitor. Rest of the differential diagnosis is underwhelming. I suspect she would benefit from cholecystectomy. Reasonable start out a single site approach. Some question of fatty change in her ultrasound so low threshold to do liver biopsy. Probable cholangiogram as well. Should be an outpatient surgery. ? ?The anatomy & physiology of hepatobiliary & pancreatic function was discussed. The pathophysiology of gallbladder dysfunction was discussed. Natural history risks without surgery was discussed. I feel the risks of no intervention will  lead to serious problems that outweigh the operative risks; therefore, I recommended cholecystectomy to remove the pathology. I explained laparoscopic techniques with  possible need for an open approach. Probable cholangiogram to evaluate the bilary tract was explained as well. Possible need for a needle core biopsy if liver changes seen or history of abnormal liver labs

## 2021-10-20 NOTE — Anesthesia Procedure Notes (Signed)
Procedure Name: Intubation ?Date/Time: 10/20/2021 11:22 AM ?Performed by: Deliah Boston, CRNA ?Pre-anesthesia Checklist: Patient identified, Emergency Drugs available, Suction available and Patient being monitored ?Patient Re-evaluated:Patient Re-evaluated prior to induction ?Oxygen Delivery Method: Circle system utilized ?Preoxygenation: Pre-oxygenation with 100% oxygen ?Induction Type: IV induction ?Ventilation: Mask ventilation without difficulty ?Tube type: Oral ?Tube size: 7.0 mm ?Number of attempts: 1 ?Airway Equipment and Method: Stylet and Oral airway ?Placement Confirmation: ETT inserted through vocal cords under direct vision, positive ETCO2 and breath sounds checked- equal and bilateral ?Secured at: 22 cm ?Tube secured with: Tape ?Dental Injury: Teeth and Oropharynx as per pre-operative assessment  ? ? ? ? ?

## 2021-10-20 NOTE — Anesthesia Postprocedure Evaluation (Signed)
Anesthesia Post Note ? ?Patient: Victoria Duran ? ?Procedure(s) Performed: LAPAROSCOPIC CHOLECYSTECTOMY SINGLE SITE WITH INTRAOPERATIVE CHOLANGIOGRAM, BILATERAL TAP BLOCK (Abdomen) ? ?  ? ?Patient location during evaluation: PACU ?Anesthesia Type: General ?Level of consciousness: awake and alert ?Pain management: pain level controlled ?Vital Signs Assessment: post-procedure vital signs reviewed and stable ?Respiratory status: spontaneous breathing, nonlabored ventilation and respiratory function stable ?Cardiovascular status: blood pressure returned to baseline and stable ?Postop Assessment: no apparent nausea or vomiting ?Anesthetic complications: no ? ? ?No notable events documented. ? ?Last Vitals:  ?Vitals:  ? 10/20/21 1330 10/20/21 1352  ?BP: 133/71 114/70  ?Pulse: 75 71  ?Resp: (!) 24 12  ?Temp: 36.6 ?C   ?SpO2: 99% 94%  ?  ?Last Pain:  ?Vitals:  ? 10/20/21 1352  ?TempSrc:   ?PainSc: 0-No pain  ? ? ?  ?  ?  ?  ?  ?  ? ?[name],W. EDMOND ? ? ? ? ?

## 2021-10-20 NOTE — Discharge Instructions (Signed)
################################################################ ? ?LAPAROSCOPIC SURGERY: POST OP INSTRUCTIONS ? ?###################################################################### ? ?EAT ?Gradually transition to a high fiber diet with a fiber supplement over the next few weeks after discharge.  Start with a pureed / full liquid diet (see below) ? ?WALK ?Walk an hour a day.  Control your pain to do that.   ? ?CONTROL PAIN ?Control pain so that you can walk, sleep, tolerate sneezing/coughing, go up/down stairs. ? ?HAVE A BOWEL MOVEMENT DAILY ?Keep your bowels regular to avoid problems.  OK to try a laxative to override constipation.  OK to use an antidairrheal to slow down diarrhea.  Call if not better after 2 tries ? ?CALL IF YOU HAVE PROBLEMS/CONCERNS ?Call if you are still struggling despite following these instructions. ?Call if you have concerns not answered by these instructions ? ?###################################################################### ? ? ? ?DIET: Follow a light bland diet & liquids the first 24 hours after arrival home, such as soup, liquids, starches, etc.  Be sure to drink plenty of fluids.  Quickly advance to a usual solid diet within a few days.  Avoid fast food or heavy meals as your are more likely to get nauseated or have irregular bowels.  A low-fat, high-fiber diet for the rest of your life is ideal. ? ?Take your usually prescribed home medications unless otherwise directed. ? ?PAIN CONTROL: ?Pain is best controlled by a usual combination of three different methods TOGETHER: ?Ice/Heat ?Over the counter pain medication ?Prescription pain medication ?Most patients will experience some swelling and bruising around the incisions.  Ice packs or heating pads (30-60 minutes up to 6 times a day) will help. Use ice for the first few days to help decrease swelling and bruising, then switch to heat to help relax tight/sore spots and speed recovery.  Some people prefer to use ice alone, heat  alone, alternating between ice & heat.  Experiment to what works for you.  Swelling and bruising can take several weeks to resolve.   ?It is helpful to take an over-the-counter pain medication regularly for the first few weeks.  Choose one of the following that works best for you: ?Naproxen (Aleve, etc)  Two 220mg tabs twice a day ?Ibuprofen (Advil, etc) Three 200mg tabs four times a day (every meal & bedtime) ?Acetaminophen (Tylenol, etc) 500-650mg four times a day (every meal & bedtime) ?A  prescription for pain medication (such as oxycodone, hydrocodone, tramadol, gabapentin, methocarbamol, etc) should be given to you upon discharge.  Take your pain medication as prescribed.  ?If you are having problems/concerns with the prescription medicine (does not control pain, nausea, vomiting, rash, itching, etc), please call us [phone] to see if we need to switch you to a different pain medicine that will work better for you and/or control your side effect better. ?If you need a refill on your pain medication, please give us 48 hour notice.  contact your pharmacy.  They will contact our office to request authorization. Prescriptions will not be filled after 5 pm or on week-ends ? ?Avoid getting constipated.   ?Between the surgery and the pain medications, it is common to experience some constipation.   ?Increasing fluid intake and taking a fiber supplement (such as Metamucil, Citrucel, FiberCon, MiraLax, etc) 1-2 times a day regularly will usually help prevent this problem from occurring.   ?A mild laxative (prune juice, Milk of Magnesia, MiraLax, etc) should be taken according to package directions if there are no bowel movements after 48 hours.   ?Watch out for diarrhea.   ?  If you have many loose bowel movements, simplify your diet to bland foods & liquids for a few days.   Stop any stool softeners and decrease your fiber supplement.   Switching to mild anti-diarrheal medications (Kayopectate, Pepto Bismol) can  help.   If this worsens or does not improve, please call us.  Wash / shower every day.  You may shower over the dressings as they are waterproof.  Continue to shower over incision(s) after the dressing is off.  It is good for closed incisions and even open wounds to be washed every day.  Shower every day.  Short baths are fine.  Wash the incisions and wounds clean with soap & water.    You may leave closed incisions open to air if it is dry.   You may cover the incision with clean gauze & replace it after your daily shower for comfort.  TEGADERM:  You have clear gauze band-aid dressings over your closed incision(s).  Remove the dressings 3 days after surgery.    ACTIVITIES as tolerated:   You may resume regular (light) daily activities beginning the next day--such as daily self-care, walking, climbing stairs--gradually increasing activities as tolerated.  If you can walk 30 minutes without difficulty, it is safe to try more intense activity such as jogging, treadmill, bicycling, low-impact aerobics, swimming, etc. Save the most intensive and strenuous activity for last such as sit-ups, heavy lifting, contact sports, etc  Refrain from any heavy lifting or straining until you are off narcotics for pain control.   DO NOT PUSH THROUGH PAIN.  Let pain be your guide: If it hurts to do something, don't do it.  Pain is your body warning you to avoid that activity for another week until the pain goes down. You may drive when you are no longer taking prescription pain medication, you can comfortably wear a seatbelt, and you can safely maneuver your car and apply brakes. You may have sexual intercourse when it is comfortable.  FOLLOW UP in our office Please call [facility] at [phone] to set up an appointment to see your surgeon in the office for a follow-up appointment approximately 2-3 weeks after your surgery. Make sure that you call for this appointment the day you arrive home to insure a convenient  appointment time.  10. IF YOU HAVE DISABILITY OR FAMILY LEAVE FORMS, BRING THEM TO THE OFFICE FOR PROCESSING.  DO NOT GIVE THEM TO YOUR DOCTOR.   WHEN TO CALL US [phone]: Poor pain control Reactions / problems with new medications (rash/itching, nausea, etc)  Fever over 101.5 F (38.5 C) Inability to urinate Nausea and/or vomiting Worsening swelling or bruising Continued bleeding from incision. Increased pain, redness, or drainage from the incision   The clinic staff is available to answer your questions during regular business hours (8:30am-5pm).  Please don't hesitate to call and ask to speak to one of our nurses for clinical concerns.   If you have a medical emergency, go to the nearest emergency room or call 911.  A surgeon from [facility] is always on call at the hospitals   [facility] [address], [address], [place], [place]  [address] ? MAIN: [phone] ? TOLL FREE: [phone] ?  FAX [phone] [email or web address]  ##############################################################  

## 2021-10-20 NOTE — Anesthesia Preprocedure Evaluation (Addendum)
Anesthesia Evaluation  ?Patient identified by MRN, date of birth, ID band ?Patient awake ? ? ? ?Reviewed: ?Allergy & Precautions, H&P , NPO status , Patient's Chart, lab work & pertinent test results ? ?History of Anesthesia Complications ?(+) PONV and history of anesthetic complications ? ?Airway ?Mallampati: II ? ?TM Distance: >3 FB ?Neck ROM: Full ? ? ? Dental ?no notable dental hx. ?(+) Teeth Intact, Dental Advisory Given ?  ?Pulmonary ?neg pulmonary ROS,  ?  ?Pulmonary exam normal ?breath sounds clear to auscultation ? ? ? ? ? ? Cardiovascular ?negative cardio ROS ? ? ?Rhythm:Regular Rate:Normal ? ? ?  ?Neuro/Psych ? Headaches, negative psych ROS  ? GI/Hepatic ?Neg liver ROS, GERD  Medicated,  ?Endo/Other  ?negative endocrine ROS ? Renal/GU ?negative Renal ROS  ?negative genitourinary ?  ?Musculoskeletal ? ? Abdominal ?  ?Peds ? Hematology ?negative hematology ROS ?(+)   ?Anesthesia Other Findings ? ? Reproductive/Obstetrics ?negative OB ROS ? ?  ? ? ? ? ? ? ? ? ? ? ? ? ? ?  ?  ? ? ? ? ? ? ? ?Anesthesia Physical ?Anesthesia Plan ? ?ASA: 2 ? ?Anesthesia Plan: General  ? ?Post-op Pain Management: Tylenol PO (pre-op)*  ? ?Induction: Intravenous ? ?PONV Risk Score and Plan: 4 or greater and Ondansetron, Dexamethasone, Midazolam and Scopolamine patch - Pre-op ? ?Airway Management Planned: Oral ETT ? ?Additional Equipment:  ? ?Intra-op Plan:  ? ?Post-operative Plan: Extubation in OR ? ?Informed Consent: I have reviewed the patients History and Physical, chart, labs and discussed the procedure including the risks, benefits and alternatives for the proposed anesthesia with the patient or authorized representative who has indicated his/her understanding and acceptance.  ? ? ? ?Dental advisory given ? ?Plan Discussed with: CRNA ? ?Anesthesia Plan Comments:   ? ? ? ? ? ?Anesthesia Quick Evaluation ? ?

## 2021-10-20 NOTE — Interval H&P Note (Signed)
History and Physical Interval Note: ? ?10/20/2021 ?10:47 AM ? ?Victoria Duran  has presented today for surgery, with the diagnosis of SYMPTOMATIC BILIARY COLIC CHOLECYSTITIS.  The various methods of treatment have been discussed with the patient and family. After consideration of risks, benefits and other options for treatment, the patient has consented to  Procedure(s): ?LAPAROSCOPIC CHOLECYSTECTOMY SINGLE SITE WITH INTRAOPERATIVE CHOLANGIOGRAM (N/A) ?NEEDLE CORE LIVER BIOPSY (N/A) as a surgical intervention.  The patient's history has been reviewed, patient examined, no change in status, stable for surgery.  I have reviewed the patient's chart and labs.  Questions were answered to the patient's satisfaction.   ? ?I have re-reviewed the the patient's records, history, medications, and allergies.  I have re-examined the patient.  I again discussed intraoperative plans and goals of post-operative recovery.  The patient agrees to proceed. ? ?Victoria Duran  ?2001-02-03 ?[id number] ? ?Patient Care Team: ?Ginger Organ., MD as PCP - General (Internal Medicine) ?Michael Boston, MD as Consulting Physician (General Surgery) ?Melida Quitter, MD as Consulting Physician (Otolaryngology) ? ?Patient Active Problem List  ? Diagnosis Date Noted  ? Impulsive type attention deficit hyperactivity disorder (ADHD) 09/13/2021  ?  Priority: Medium   ? Allergic rhinitis 09/13/2021  ? Encounter for medication management 09/13/2021  ? Chronic calculous cholecystitis 09/13/2021  ? Fatty (change of) liver, not elsewhere classified 09/13/2021  ? Acute rhinosinusitis 09/13/2021  ? Exercise-induced bronchospasm 09/13/2021  ? Warts 09/13/2021  ? Aching headache 09/13/2021  ? Episodic tension-type headache, not intractable 10/31/2017  ? Migraine without aura and without status migrainosus, not intractable 10/31/2017  ? Recurrent epistaxis 10/30/2017  ? ? ?Past Medical History:  ?Diagnosis Date  ? Acute pharyngitis 09/13/2021  ? Acute streptococcal  pharyngitis 09/13/2021  ? Closed head injury without loss of consciousness 10/31/2017  ? Concussion with loss of consciousness 09/13/2021  ? 6 weeks ago with a head trauma in soccer game with head to head contact, some dizziness, 30 sec. Headache throughout game and hurt every day throughout days 11 AM until 10 PM, taking Ibuprofen  ? GERD (gastroesophageal reflux disease)   ? Migraine headache   ? Otitis media of right ear in pediatric patient 09/13/2021  ? PCOS (polycystic ovarian syndrome)   ? PONV (postoperative nausea and vomiting)   ? ? ?Past Surgical History:  ?Procedure Laterality Date  ? LARYNGOSCOPY    ? WISDOM TOOTH EXTRACTION    ? ? ?Social History  ? ?Socioeconomic History  ? Marital status: Single  ?  Spouse name: Not on file  ? Number of children: Not on file  ? Years of education: Not on file  ? Highest education level: Not on file  ?Occupational History  ? Not on file  ?Tobacco Use  ? Smoking status: Never  ? Smokeless tobacco: Never  ?Vaping Use  ? Vaping Use: Never used  ?Substance and Sexual Activity  ? Alcohol use: Never  ? Drug use: Never  ? Sexual activity: Not on file  ?Other Topics Concern  ? Not on file  ?Social History Narrative  ? 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  ? She attended [employer]  ? She lives with both parents. She has on sister.  ? She enjoys soccer, basketball, and volleyball.  ? She attends [employer]-  ? ?Social Determinants of Health  ? ?Financial Resource Strain: Not on file  ?Food Insecurity: Not on file  ?Transportation Needs: Not on file  ?Physical Activity: Not on file  ?Stress:  Not on file  ?Social Connections: Not on file  ?Intimate Partner Violence: Not on file  ? ? ?History reviewed. No pertinent family history. ? ?Medications Prior to Admission  ?Medication Sig Dispense Refill Last Dose  ? fexofenadine (ALLEGRA) 180 MG tablet Take 180 mg by mouth daily.   10/19/2021  ? lisdexamfetamine (VYVANSE) 30 MG capsule TAKE ONE CAPSULE BY  MOUTH EACH MORNING FOR ATTENTION DEFICIT   Past Week  ? LO LOESTRIN FE 1 MG-10 MCG / 10 MCG tablet Take 1 tablet by mouth daily.   Past Month  ? metFORMIN (GLUCOPHAGE) 500 MG tablet Take 500 mg by mouth in the morning, at noon, and at bedtime.   Past Week  ? Multiple Vitamin (MULTIVITAMIN WITH MINERALS) TABS tablet Take 1 tablet by mouth daily.   10/19/2021  ? mupirocin cream (BACTROBAN) 2 % Apply 1 application. topically daily.     ? pantoprazole (PROTONIX) 20 MG tablet Take 20 mg by mouth daily.   Past Month  ? SUMAtriptan (IMITREX) 50 MG tablet Take 1 tablet with 1 or 2 Excedrin Migraine at onset of migraine, may use an additional tablet in 2 hours if headache persists or recurs. 10 tablet 5   ? SYMBICORT 80-4.5 MCG/ACT inhaler Inhale 2 puffs into the lungs in the morning and at bedtime.   10/20/2021 at 0820  ? topiramate (TOPAMAX) 25 MG tablet Take 3 tablets at nighttime (Patient taking differently: Take 50 mg by mouth at bedtime.) 270 tablet 3   ? Ubrogepant (UBRELVY) 100 MG TABS Take 100 mg by mouth daily as needed (migraine).   Past Week  ? albuterol (VENTOLIN HFA) 108 (90 Base) MCG/ACT inhaler Inhale 1-2 puffs into the lungs every 6 (six) hours as needed for wheezing or shortness of breath.   More than a month  ? ? ?Current Facility-Administered Medications  ?Medication Dose Route Frequency Provider Last Rate Last Admin  ? bupivacaine liposome (EXPAREL) 1.3 % injection 266 mg  20 mL Infiltration Once Michael Boston, MD      ? cefTRIAXone (ROCEPHIN) 2 g in sodium chloride 0.9 % 100 mL IVPB  2 g Intravenous On Call to OR Michael Boston, MD      ? Chlorhexidine Gluconate Cloth 2 % PADS 6 each  6 each Topical Once Michael Boston, MD      ? And  ? Chlorhexidine Gluconate Cloth 2 % PADS 6 each  6 each Topical Once Michael Boston, MD      ? lactated ringers infusion   Intravenous Continuous Brennan Bailey, MD 10 mL/hr at 10/20/21 1005 New Bag at 10/20/21 1005  ? scopolamine (TRANSDERM-SCOP) 1 MG/3DAYS 1.5 mg  1 patch  Transdermal Unknown Jim, MD   1.5 mg at 10/20/21 1047  ?  ? ?Allergies  ?Allergen Reactions  ? Other Nausea And Vomiting  ?  Pt had severe nausea after previous procedure  ? Neomycin Rash  ? ? ?BP (!) 172/102   Pulse (!) 107   Temp 98.3 ?F (36.8 ?C) (Oral)   Resp 16   LMP 10/11/2021 (Exact Date)   SpO2 100%  ? ?Labs: ?Results for orders placed or performed during the hospital encounter of 10/20/21 (from the past 48 hour(s))  ?Pregnancy, urine     Status: None  ? Collection Time: 10/20/21  9:35 AM  ?Result Value Ref Range  ? Preg Test, Ur NEGATIVE NEGATIVE  ?  Comment:        ?THE SENSITIVITY OF THIS ?METHODOLOGY IS >20  mIU/mL. ?Performed at [facility], [place] [address] [place], [place] [address] ?  ? ? ?Imaging / Studies: ?No results found. ?  ?.Adin Hector, M.D., F.A.C.S. ?Gastrointestinal and Minimally Invasive Surgery ?[facility] ?[address], [address] ?[place], [place] [address] ?([phone] Main / Paging ? ?10/20/2021 ?10:48 AM ? ? ? ?Adin Hector ? ? ?

## 2021-10-21 ENCOUNTER — Encounter (HOSPITAL_COMMUNITY): Payer: Self-pay | Admitting: Surgery

## 2021-10-21 LAB — SURGICAL PATHOLOGY

## 2022-07-01 IMAGING — US US ABDOMEN LIMITED
1 series · 14 of 25 positions shown · non-contrast
Comparison: None.

CLINICAL DATA: Epigastric pain.

EXAM:
ULTRASOUND ABDOMEN LIMITED RIGHT UPPER QUADRANT

[Series 1: us abdomen limited · 0.25mm/px · 14 of 43 slices shown]
[im 1/43]
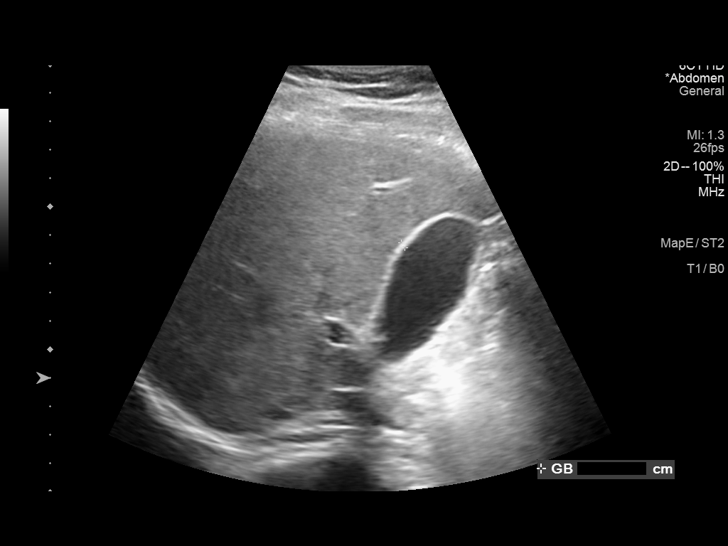
[im 4/43]
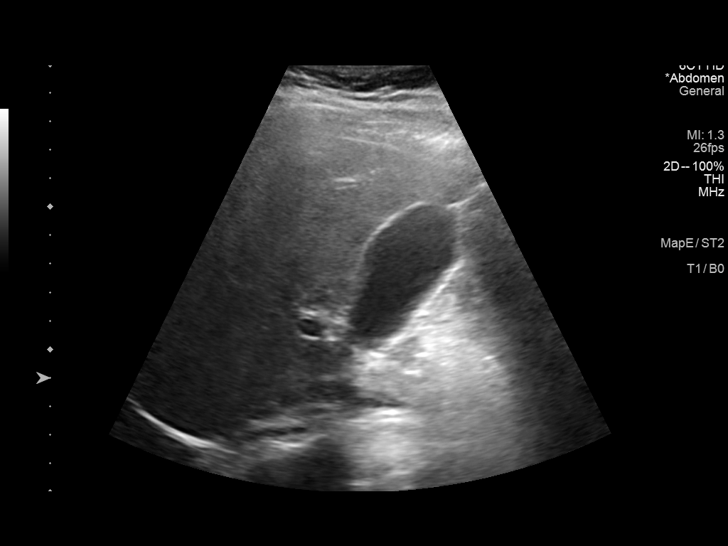
[im 8/43]
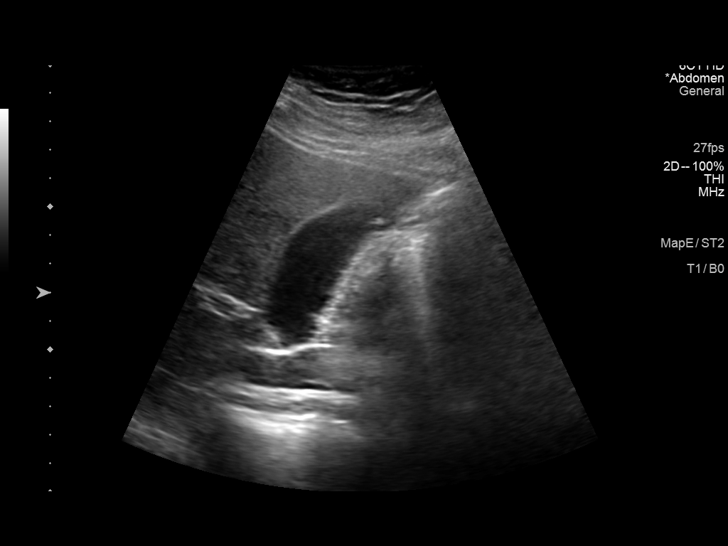
[im 11/43]
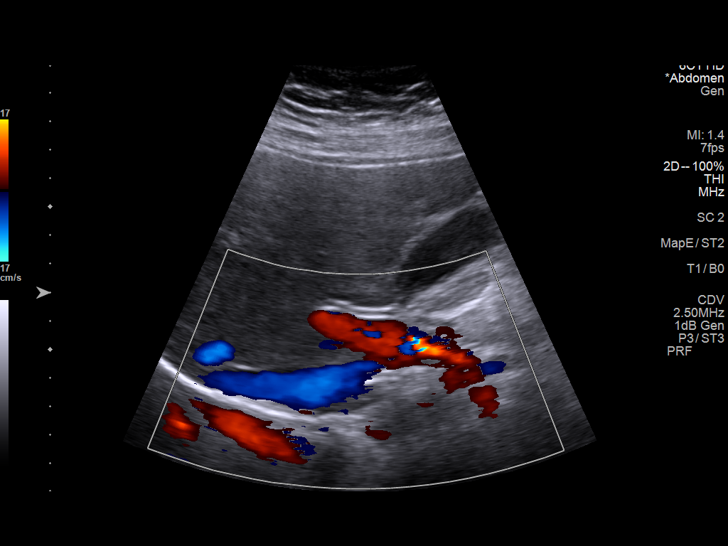
[im 15/43]
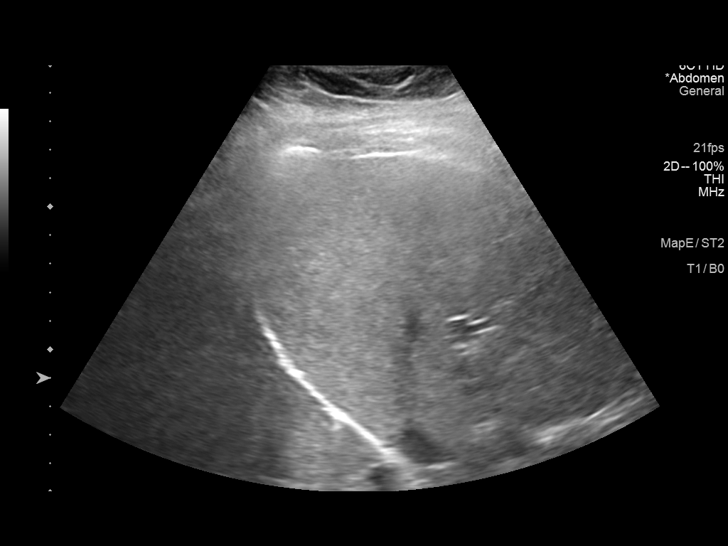
[im 16/43]
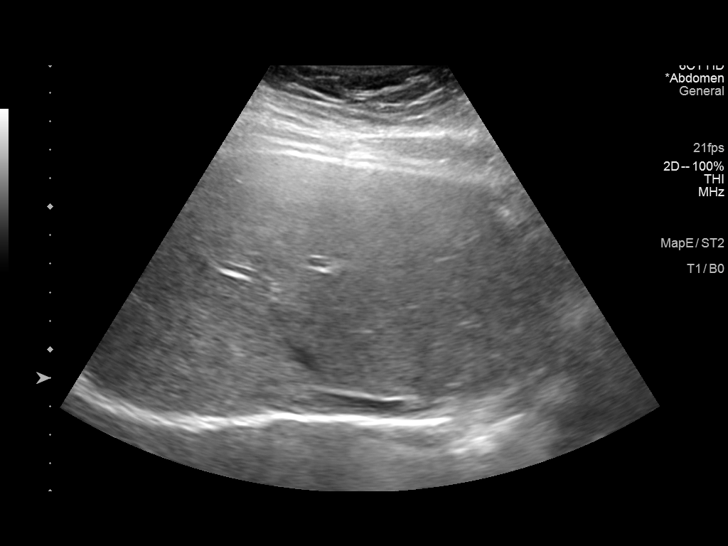
[im 20/43]
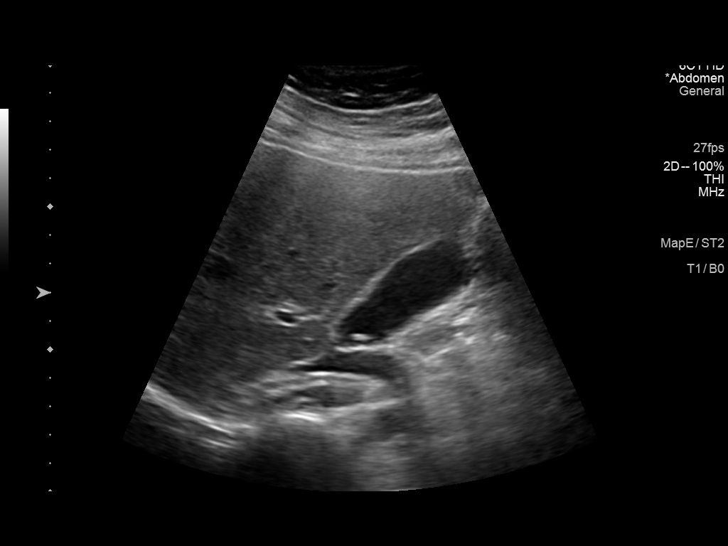
[im 23/43]
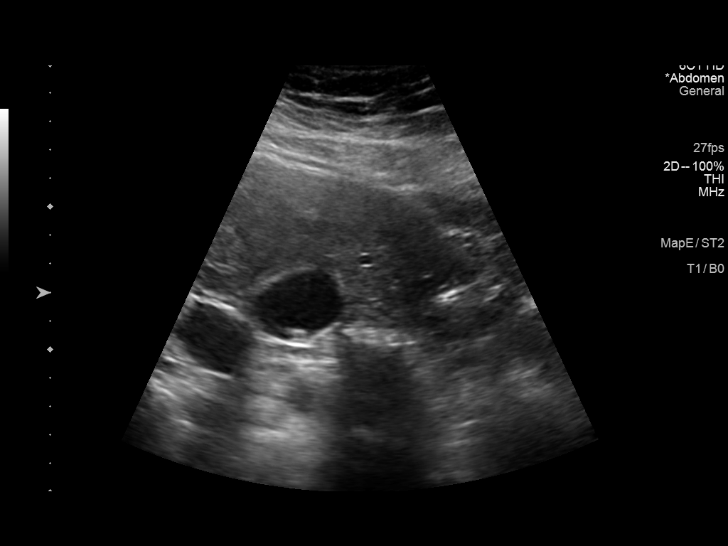
[im 27/43]
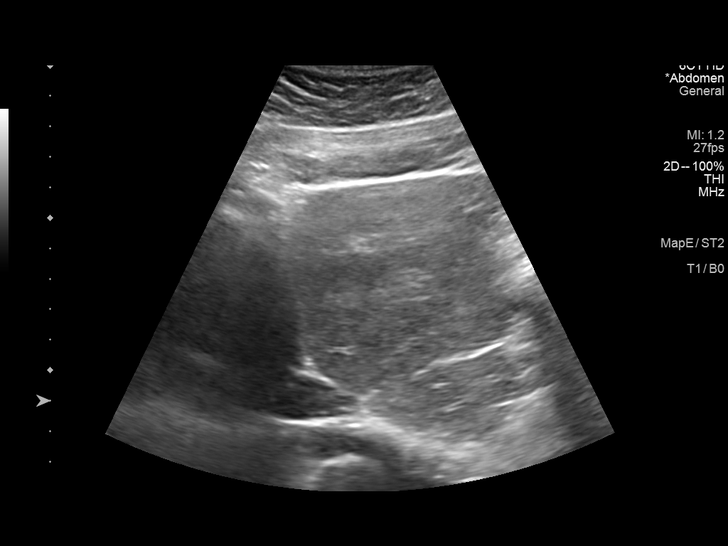
[im 29/43]
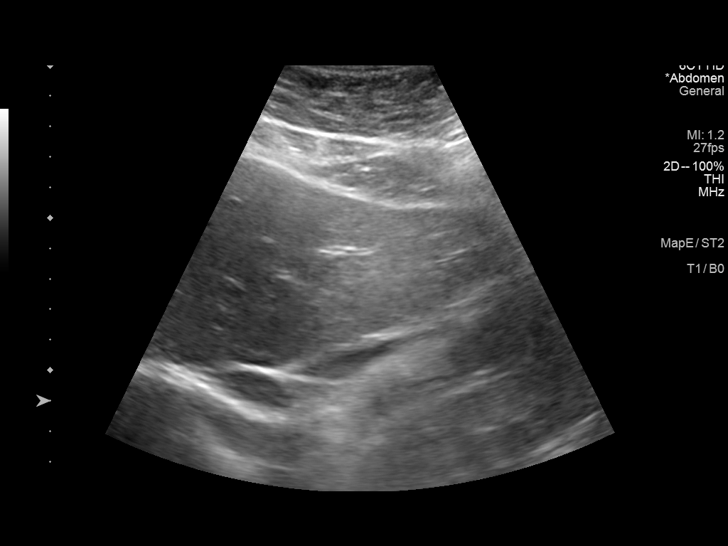
[im 32/43]
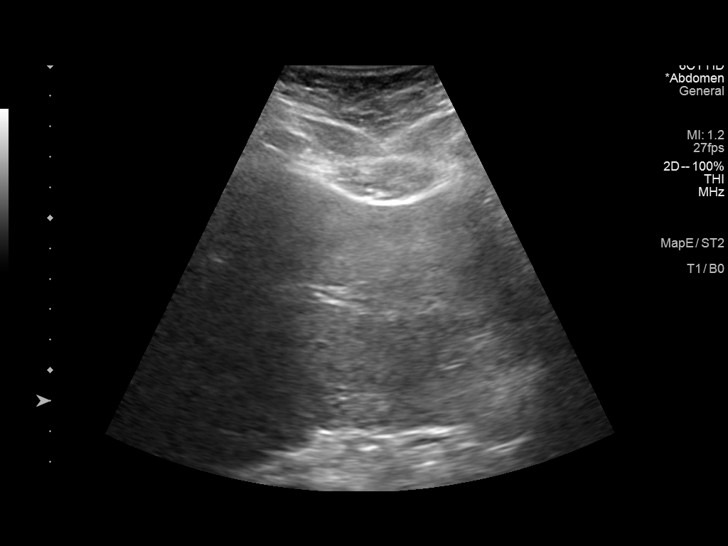
[im 36/43]
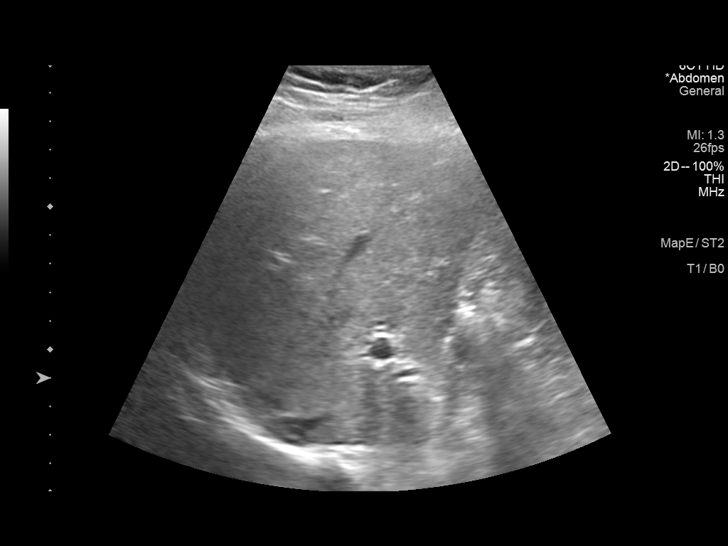
[im 39/43]
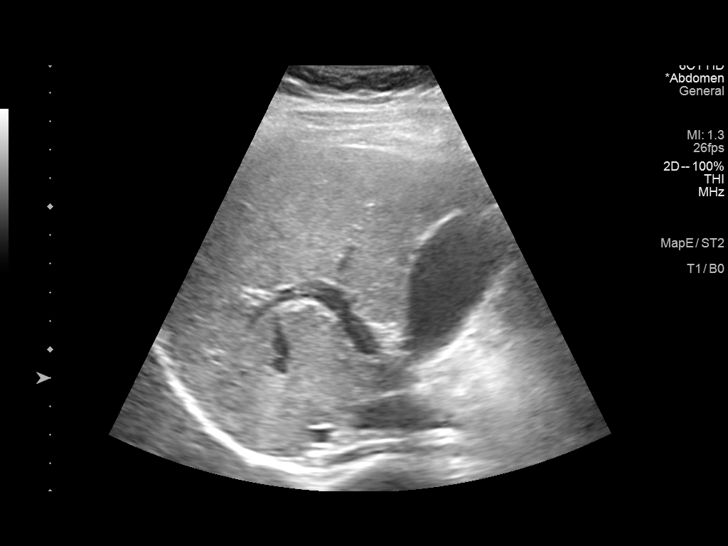
[im 43/43]
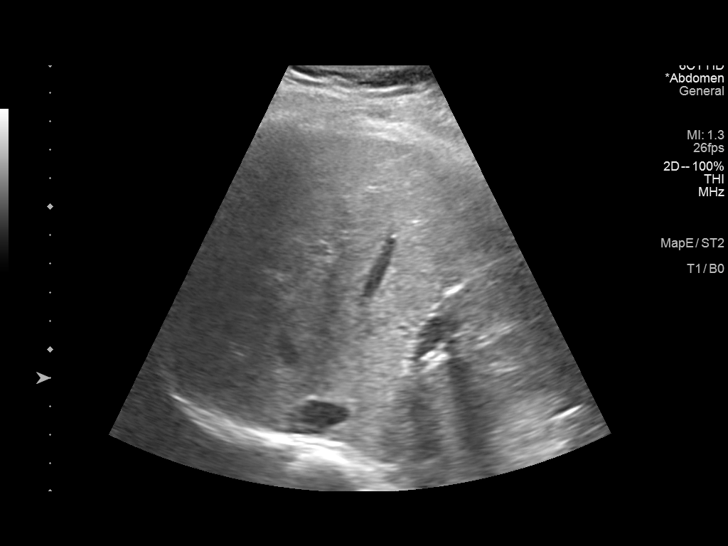

[14 of 25 positions shown; findings below may reference images not displayed]

FINDINGS: Gallbladder:

A gallstone is identified measuring 8 mm. There is no gallbladder
wall thickening. No sonographic Murphy sign noted by sonographer.

Common bile duct:

Diameter: 1.6 mm

Liver:

No focal lesion identified. Mild increase in parenchymal
echogenicity. Portal vein is patent on color Doppler imaging with
normal direction of blood flow towards the liver.

Other:

1. Cholelithiasis. No additional sonographic evidence for acute
cholecystitis.
2. Mildly echogenic liver may be related to fatty infiltration.
Correlate clinically.

## 2022-09-04 IMAGING — RF DG CHOLANGIOGRAM OPERATIVE
1 series · 5 of 5 positions shown · non-contrast
Comparison: Ultrasound 08/16/2021

CLINICAL DATA: Cholelithiasis.

EXAM:
INTRAOPERATIVE CHOLANGIOGRAM
TECHNIQUE: Cholangiographic images from the C-arm fluoroscopic device were
submitted for interpretation post-operatively. Please see the
procedural report for the amount of contrast and the fluoroscopy
time utilized.

[Series 1: run · 2 acquisitions, 5 frames shown]
[im 1/2]
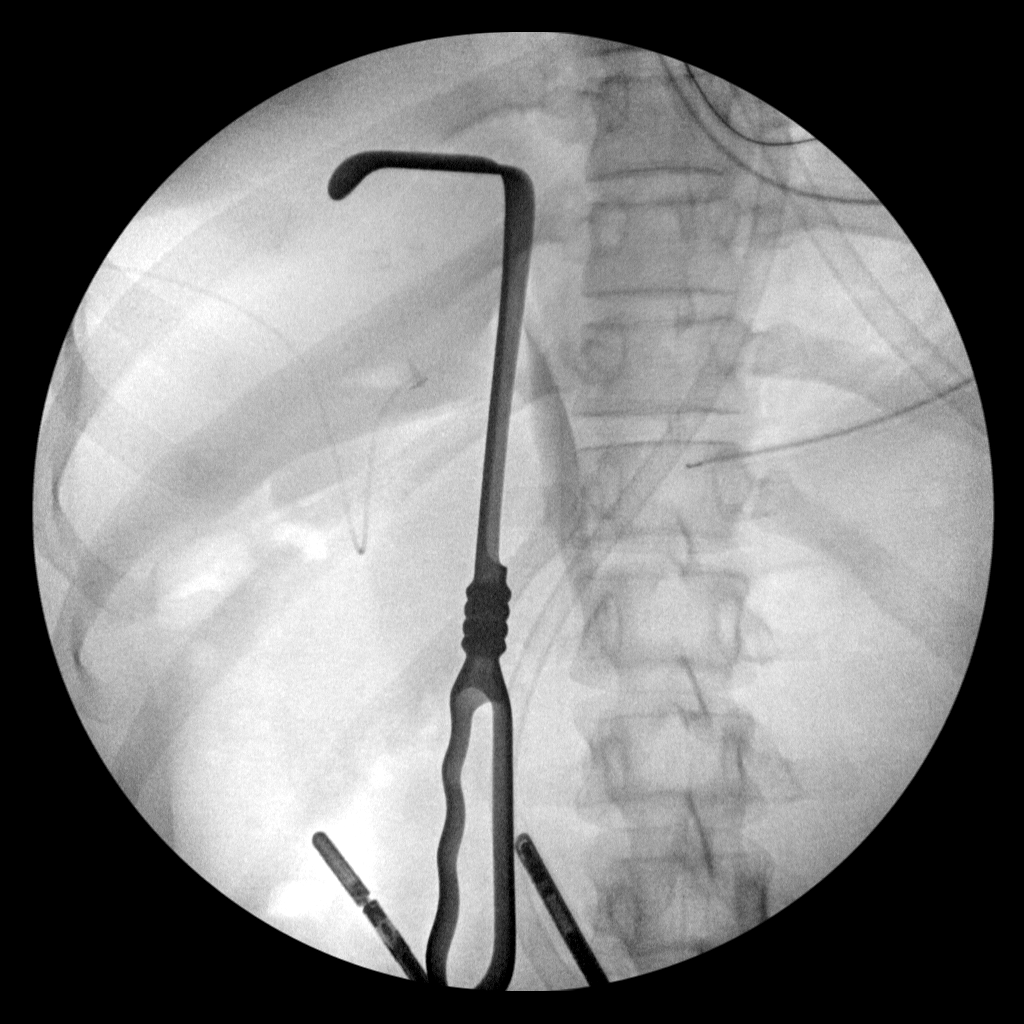
[im 2/2]
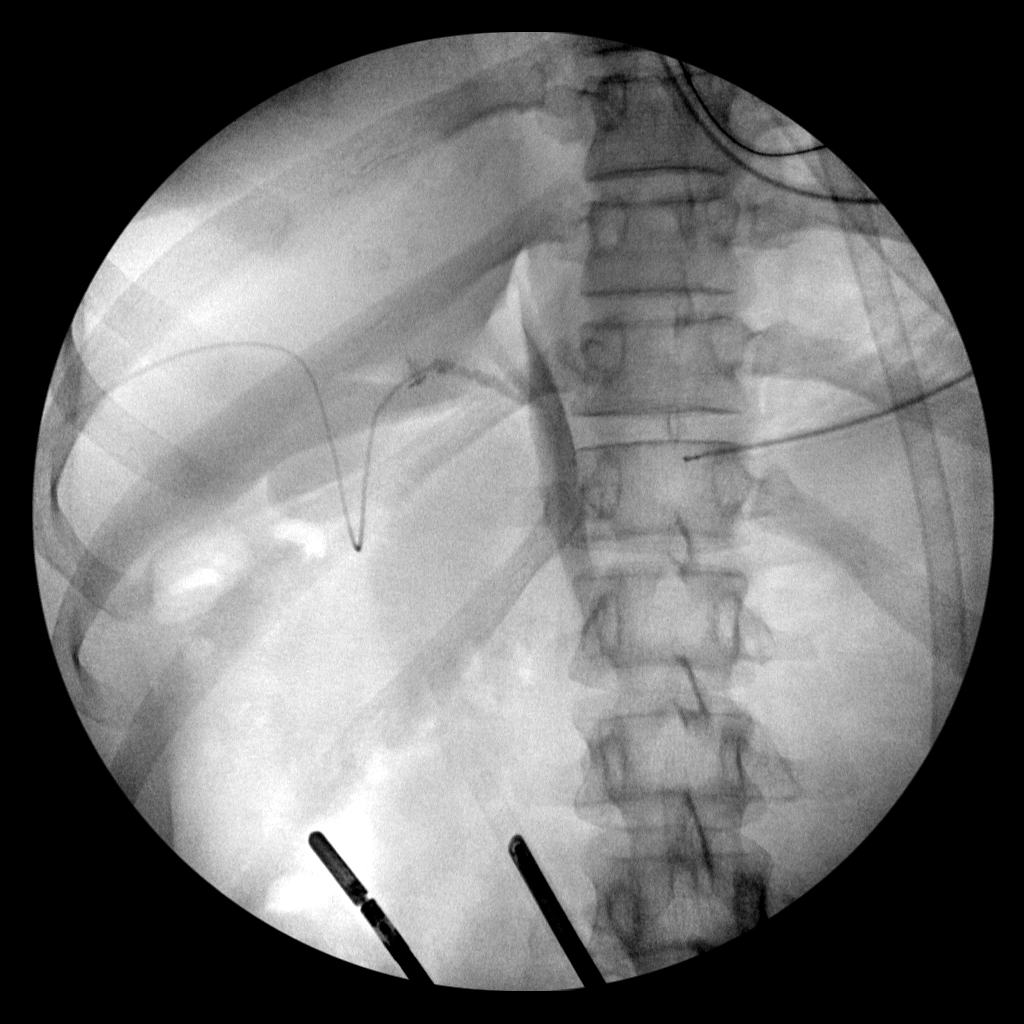
[im 2/2]
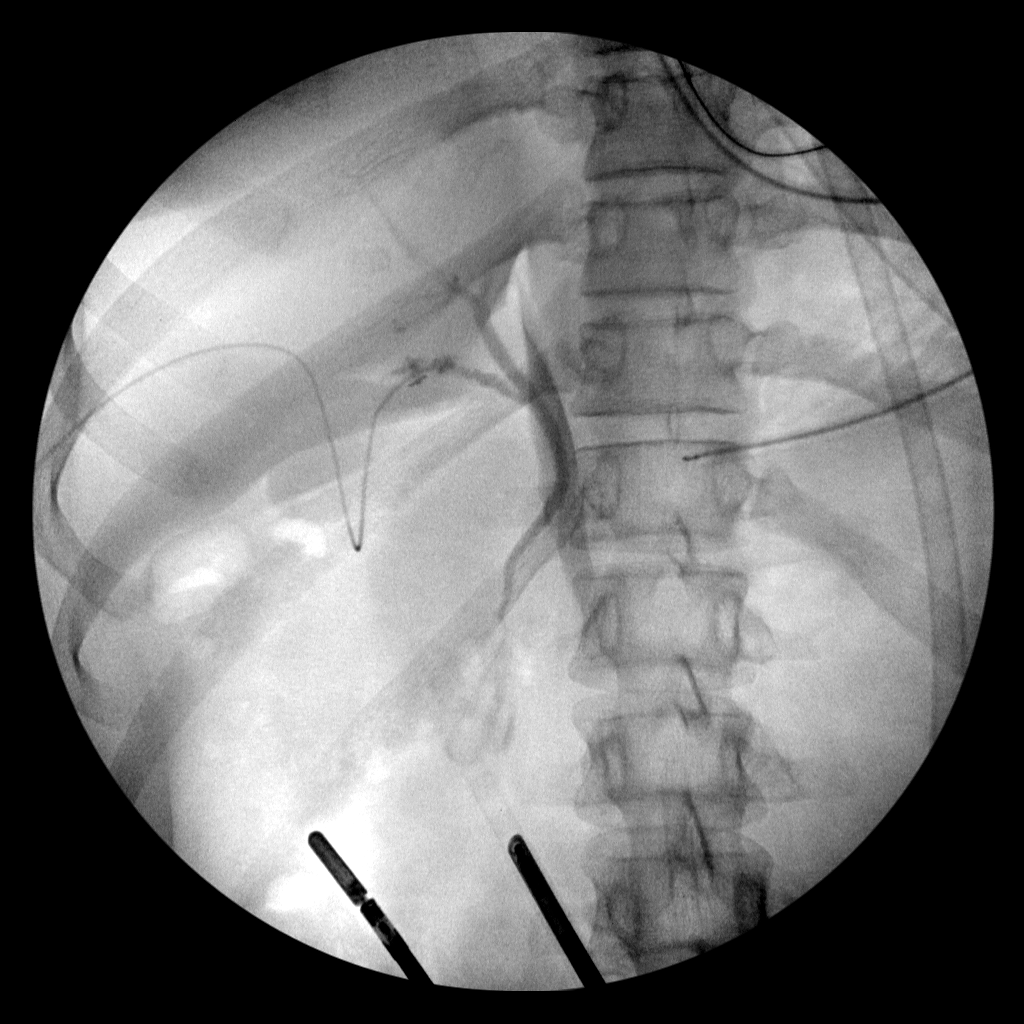
[im 2/2]
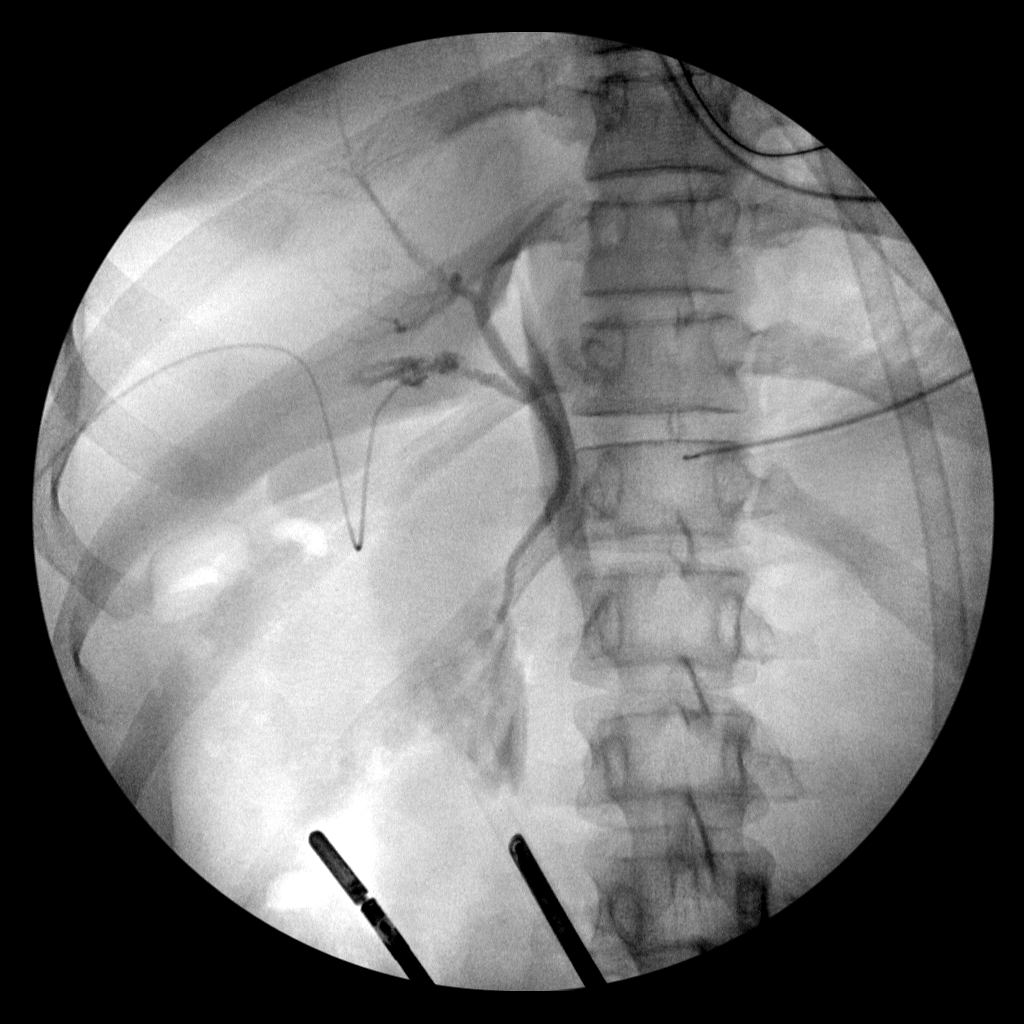
[im 2/2]
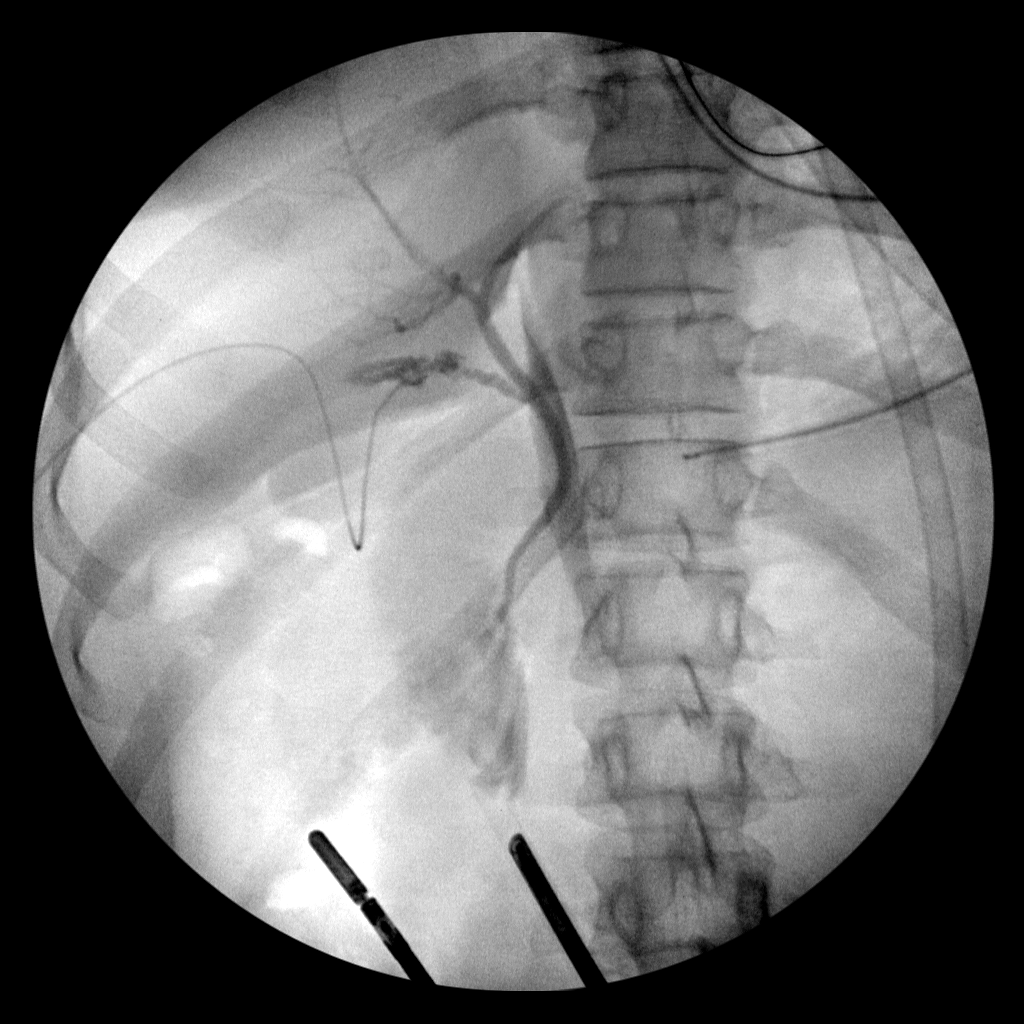

[5 of 5 positions shown; findings below may reference images not displayed]

FINDINGS: Contrast opacification of the cystic duct, common bile duct and
duodenum. Contrast refluxes into the common hepatic duct and
intrahepatic bile ducts. No biliary dilatation. Small amount of
extravasation at the catheter cannulation site. No large filling
defects or stones.

Radiation Exposure Index (as provided by the fluoroscopic device):
3.08 mGy Kerma
IMPRESSION: Patent biliary system.  No obstructing stones.

## 2023-02-10 ENCOUNTER — Other Ambulatory Visit (HOSPITAL_COMMUNITY): Payer: Self-pay

## 2023-02-10 MED ORDER — SEMAGLUTIDE-WEIGHT MANAGEMENT 0.5 MG/0.5ML ~~LOC~~ SOAJ
0.5000 mg | SUBCUTANEOUS | 3 refills | Status: AC
  Filled 2023-02-10: qty 2, 28d supply, fill #0
  Filled 2023-03-13 – 2023-03-29 (×3): qty 2, 28d supply, fill #1

## 2023-03-13 ENCOUNTER — Other Ambulatory Visit (HOSPITAL_COMMUNITY): Payer: Self-pay

## 2023-03-28 ENCOUNTER — Other Ambulatory Visit (HOSPITAL_COMMUNITY): Payer: Self-pay

## 2023-03-29 ENCOUNTER — Other Ambulatory Visit: Payer: Self-pay

## 2023-03-30 ENCOUNTER — Other Ambulatory Visit: Payer: Self-pay

## 2024-07-17 ENCOUNTER — Other Ambulatory Visit (HOSPITAL_COMMUNITY): Payer: Self-pay
